# Patient Record
Sex: Female | Born: 1975 | Race: White | Hispanic: No | Smoking: Never smoker
Health system: Southern US, Community
[De-identification: ages and names within clinical notes are randomized; demographics above are authoritative.]

## PROBLEM LIST (undated history)

## (undated) ENCOUNTER — Inpatient Hospital Stay: Payer: Self-pay

## (undated) ENCOUNTER — Inpatient Hospital Stay: Admission: EM | Payer: Self-pay

## (undated) DIAGNOSIS — D649 Anemia, unspecified: Secondary | ICD-10-CM

## (undated) DIAGNOSIS — R011 Cardiac murmur, unspecified: Secondary | ICD-10-CM

## (undated) HISTORY — PX: WISDOM TOOTH EXTRACTION: SHX21

---

## 2002-02-20 ENCOUNTER — Other Ambulatory Visit: Admission: RE | Admit: 2002-02-20 | Discharge: 2002-02-20 | Payer: Self-pay | Admitting: Obstetrics and Gynecology

## 2002-07-17 ENCOUNTER — Inpatient Hospital Stay (HOSPITAL_COMMUNITY): Admission: AD | Admit: 2002-07-17 | Discharge: 2002-07-17 | Payer: Self-pay | Admitting: Obstetrics and Gynecology

## 2002-08-27 ENCOUNTER — Inpatient Hospital Stay (HOSPITAL_COMMUNITY): Admission: AD | Admit: 2002-08-27 | Discharge: 2002-08-30 | Payer: Self-pay | Admitting: Obstetrics and Gynecology

## 2002-08-31 ENCOUNTER — Encounter: Admission: RE | Admit: 2002-08-31 | Discharge: 2002-09-30 | Payer: Self-pay | Admitting: Obstetrics and Gynecology

## 2002-10-27 ENCOUNTER — Other Ambulatory Visit: Admission: RE | Admit: 2002-10-27 | Discharge: 2002-10-27 | Payer: Self-pay | Admitting: Obstetrics and Gynecology

## 2003-10-18 ENCOUNTER — Inpatient Hospital Stay (HOSPITAL_COMMUNITY): Admission: RE | Admit: 2003-10-18 | Discharge: 2003-10-18 | Payer: Self-pay | Admitting: Obstetrics and Gynecology

## 2003-10-22 ENCOUNTER — Inpatient Hospital Stay (HOSPITAL_COMMUNITY): Admission: AD | Admit: 2003-10-22 | Discharge: 2003-10-24 | Payer: Self-pay | Admitting: Obstetrics and Gynecology

## 2003-11-07 ENCOUNTER — Inpatient Hospital Stay (HOSPITAL_COMMUNITY): Admission: AD | Admit: 2003-11-07 | Discharge: 2003-11-07 | Payer: Self-pay | Admitting: Obstetrics & Gynecology

## 2003-11-20 ENCOUNTER — Ambulatory Visit: Payer: Self-pay | Admitting: Obstetrics and Gynecology

## 2003-11-20 ENCOUNTER — Ambulatory Visit (HOSPITAL_COMMUNITY): Admission: RE | Admit: 2003-11-20 | Discharge: 2003-11-20 | Payer: Self-pay | Admitting: Obstetrics and Gynecology

## 2003-11-21 ENCOUNTER — Inpatient Hospital Stay (HOSPITAL_COMMUNITY): Admission: AD | Admit: 2003-11-21 | Discharge: 2003-11-23 | Payer: Self-pay | Admitting: Obstetrics and Gynecology

## 2004-01-04 ENCOUNTER — Other Ambulatory Visit: Admission: RE | Admit: 2004-01-04 | Discharge: 2004-01-04 | Payer: Self-pay | Admitting: Obstetrics and Gynecology

## 2010-04-06 ENCOUNTER — Encounter: Payer: Self-pay | Admitting: Obstetrics and Gynecology

## 2014-11-02 LAB — OB RESULTS CONSOLE HIV ANTIBODY (ROUTINE TESTING): HIV: NONREACTIVE

## 2014-11-02 LAB — OB RESULTS CONSOLE RPR: RPR: NONREACTIVE

## 2014-11-02 LAB — OB RESULTS CONSOLE ANTIBODY SCREEN: Antibody Screen: NEGATIVE

## 2014-11-02 LAB — OB RESULTS CONSOLE HEPATITIS B SURFACE ANTIGEN: HEP B S AG: NEGATIVE

## 2015-02-28 ENCOUNTER — Encounter: Payer: Self-pay | Admitting: *Deleted

## 2015-02-28 ENCOUNTER — Observation Stay
Admission: EM | Admit: 2015-02-28 | Discharge: 2015-02-28 | Disposition: A | Discharge status: Home / Self Care | Payer: Medicaid - Out of State | Attending: Obstetrics & Gynecology | Admitting: Obstetrics & Gynecology

## 2015-02-28 ENCOUNTER — Observation Stay
Admission: EM | Admit: 2015-02-28 | Discharge: 2015-02-28 | Disposition: A | Discharge status: Home / Self Care | Payer: Medicaid - Out of State | Attending: Obstetrics and Gynecology | Admitting: Obstetrics and Gynecology

## 2015-02-28 ENCOUNTER — Observation Stay: Payer: Medicaid - Out of State

## 2015-02-28 DIAGNOSIS — O26892 Other specified pregnancy related conditions, second trimester: Secondary | ICD-10-CM | POA: Diagnosis not present

## 2015-02-28 DIAGNOSIS — Z3A24 24 weeks gestation of pregnancy: Secondary | ICD-10-CM | POA: Insufficient documentation

## 2015-02-28 DIAGNOSIS — O479 False labor, unspecified: Secondary | ICD-10-CM | POA: Diagnosis present

## 2015-02-28 DIAGNOSIS — R1031 Right lower quadrant pain: Secondary | ICD-10-CM

## 2015-02-28 DIAGNOSIS — O26899 Other specified pregnancy related conditions, unspecified trimester: Secondary | ICD-10-CM

## 2015-02-28 DIAGNOSIS — R112 Nausea with vomiting, unspecified: Secondary | ICD-10-CM

## 2015-02-28 DIAGNOSIS — R109 Unspecified abdominal pain: Secondary | ICD-10-CM | POA: Diagnosis present

## 2015-02-28 DIAGNOSIS — Z3A25 25 weeks gestation of pregnancy: Secondary | ICD-10-CM | POA: Insufficient documentation

## 2015-02-28 LAB — COMPREHENSIVE METABOLIC PANEL
ALT: 12 U/L — ABNORMAL LOW (ref 14–54)
AST: 15 U/L (ref 15–41)
Albumin: 3.1 g/dL — ABNORMAL LOW (ref 3.5–5.0)
Alkaline Phosphatase: 72 U/L (ref 38–126)
Anion gap: 5 (ref 5–15)
BUN: 10 mg/dL (ref 6–20)
CHLORIDE: 106 mmol/L (ref 101–111)
CO2: 21 mmol/L — ABNORMAL LOW (ref 22–32)
Calcium: 8.3 mg/dL — ABNORMAL LOW (ref 8.9–10.3)
Creatinine, Ser: 0.51 mg/dL (ref 0.44–1.00)
Glucose, Bld: 108 mg/dL — ABNORMAL HIGH (ref 65–99)
POTASSIUM: 3.5 mmol/L (ref 3.5–5.1)
Sodium: 132 mmol/L — ABNORMAL LOW (ref 135–145)
Total Bilirubin: 0.2 mg/dL — ABNORMAL LOW (ref 0.3–1.2)
Total Protein: 6.9 g/dL (ref 6.5–8.1)

## 2015-02-28 LAB — URINE DRUG SCREEN, QUALITATIVE (ARMC ONLY)
AMPHETAMINES, UR SCREEN: NOT DETECTED
Barbiturates, Ur Screen: NOT DETECTED
Benzodiazepine, Ur Scrn: NOT DETECTED
CANNABINOID 50 NG, UR ~~LOC~~: NOT DETECTED
Cocaine Metabolite,Ur ~~LOC~~: NOT DETECTED
MDMA (ECSTASY) UR SCREEN: NOT DETECTED
METHADONE SCREEN, URINE: NOT DETECTED
Opiate, Ur Screen: NOT DETECTED
Phencyclidine (PCP) Ur S: NOT DETECTED
TRICYCLIC, UR SCREEN: NOT DETECTED

## 2015-02-28 LAB — CBC
HCT: 34.8 % — ABNORMAL LOW (ref 35.0–47.0)
Hemoglobin: 11.6 g/dL — ABNORMAL LOW (ref 12.0–16.0)
MCH: 29.6 pg (ref 26.0–34.0)
MCHC: 33.2 g/dL (ref 32.0–36.0)
MCV: 89 fL (ref 80.0–100.0)
PLATELETS: 238 10*3/uL (ref 150–440)
RBC: 3.91 MIL/uL (ref 3.80–5.20)
RDW: 13.1 % (ref 11.5–14.5)
WBC: 12.1 10*3/uL — AB (ref 3.6–11.0)

## 2015-02-28 LAB — URINALYSIS COMPLETE WITH MICROSCOPIC (ARMC ONLY)
BILIRUBIN URINE: NEGATIVE
Bacteria, UA: NONE SEEN
GLUCOSE, UA: NEGATIVE mg/dL
HGB URINE DIPSTICK: NEGATIVE
Ketones, ur: NEGATIVE mg/dL
LEUKOCYTES UA: NEGATIVE
NITRITE: NEGATIVE
PH: 7 (ref 5.0–8.0)
Protein, ur: NEGATIVE mg/dL
RBC / HPF: NONE SEEN RBC/hpf (ref 0–5)
SPECIFIC GRAVITY, URINE: 1.004 — AB (ref 1.005–1.030)

## 2015-02-28 LAB — LIPASE, BLOOD: Lipase: 22 U/L (ref 11–51)

## 2015-02-28 MED ORDER — MORPHINE SULFATE (PF) 4 MG/ML IV SOLN
4.0000 mg | INTRAVENOUS | Status: AC
  Administered 2015-02-28: 4 mg via INTRAMUSCULAR

## 2015-02-28 MED ORDER — SODIUM CHLORIDE 0.9 % IV SOLN
8.0000 mg | INTRAVENOUS | Status: DC
  Filled 2015-02-28: qty 4

## 2015-02-28 MED ORDER — ACETAMINOPHEN 325 MG PO TABS: 650.0000 mg | ORAL_TABLET | ORAL | Status: DC | PRN

## 2015-02-28 MED ORDER — LACTATED RINGERS IV SOLN
INTRAVENOUS | Status: DC
  Administered 2015-02-28: 09:00:00 via INTRAVENOUS

## 2015-02-28 MED ORDER — LACTATED RINGERS IV BOLUS (SEPSIS): 500.0000 mL | Freq: Once | INTRAVENOUS | Status: DC

## 2015-02-28 MED ORDER — ONDANSETRON HCL 4 MG/2ML IJ SOLN: 4.0000 mg | Freq: Four times a day (QID) | INTRAMUSCULAR | Status: DC | PRN

## 2015-02-28 MED ORDER — PROMETHAZINE HCL 12.5 MG PO TABS: 25.0000 mg | ORAL_TABLET | Freq: Four times a day (QID) | ORAL | Status: DC | PRN

## 2015-02-28 MED ORDER — ONDANSETRON HCL 4 MG/2ML IJ SOLN
INTRAMUSCULAR | Status: AC
  Administered 2015-02-28: 4 mg
  Filled 2015-02-28: qty 4

## 2015-02-28 MED ORDER — FENTANYL CITRATE (PF) 100 MCG/2ML IJ SOLN
25.0000 ug | INTRAMUSCULAR | Status: DC
  Filled 2015-02-28: qty 2

## 2015-02-28 MED ORDER — MORPHINE SULFATE (PF) 4 MG/ML IV SOLN
INTRAVENOUS | Status: AC
  Administered 2015-02-28: 4 mg via INTRAMUSCULAR
  Filled 2015-02-28: qty 1

## 2015-02-28 NOTE — H&P (Signed)
OB History & Physical   History of Present Illness:  Chief Complaint: right side pain  HPI:  Shawna Sellers is a 39 y.o. [id number] female at [redacted]w[redacted]d dated by unknown criteria.  Her pregnancy has been without complication.    She denies contractions.   She denies leakage of fluid.   She denies vaginal bleeding.   She reports fetal movement.    She presents with acute-onset right sided pain that started at 4am this morning.  She rates the pain as 9/10.  It is located in her right flank, wrapping around from her right lower back to her right lower abdomen. The pain is vaguely described. It does not radiate.  Movement makes it worse. Sitting still makes it a little better.  She has had nausea and emesis associated with the pain. She denies fevers and chills.  Maternal Medical History:  History reviewed. No pertinent past medical history.  Past Surgical History  Procedure Laterality Date  . No past surgeries     Allergies: No Known Allergies  Prior to Admission medications   Medication Sig Start Date End Date Taking? Authorizing Provider  Prenatal Vit-Fe Fumarate-FA (MULTIVITAMIN-PRENATAL) 27-0.8 MG TABS tablet Take 1 tablet by mouth daily at 12 noon.   Yes Historical Provider, MD    OB History  Gravida Para Term Preterm AB SAB TAB Ectopic Multiple Living  # Outcome Date GA Lbr Len/2nd Weight Sex Delivery Anes PTL Lv  7 Current           6 Term 09/14/11     Vag-Spont   Y  5 Term 03/22/09     Vag-Spont   Y  4 Term 03/16/09     Vag-Spont   Y  3 Preterm 11/15/03 [redacted]w[redacted]d    Vag-Spont   Y  2 Term 08/28/02     Vag-Spont   Y  1 SAB               Prenatal care site: OBGYN in [place].   Social History: She  reports that she has never smoked. She has never used smokeless tobacco. She reports that she does not drink alcohol or use illicit drugs. Recently moved to the area from [place].  Family History: family history is not on file.   Review of Systems: Negative x 10  systems reviewed except as noted in the HPI.    Physical Exam:  Vital Signs: BP 126/73 mmHg  Pulse 70  Temp(Src) 97.6 F (36.4 C) (Oral)  Resp 16 General: moderate distress.  HEENT: normocephalic, atraumatic Heart: regular rate & rhythm.  No murmurs/rubs/gallops Lungs: clear to auscultation bilaterally Abdomen: soft, gravid, non-tender;  Right posterior flank along iliac crest tender to palpation extending anteriorly.  No rebound or guarding Extremities: non-tender, symmetric, no edema bilaterally.    Neurologic: Alert & oriented x 3.    Pertinent Results:  Prenatal Labs:  None known  Baseline FHR: normal range and characteristics given gestational age Contractions: absent  Overall assessment: reassuring given gestational age  Assessment:  Shawna Sellers is a 39 y.o. [phone] female at [redacted]w[redacted]d with right flank pain.  Differential includes; appendicitis, ureterolithiasis, MSK, placental abruption.  Plan:  1. Admit to observation  2. Will start IV and give IV hydration, along with pain medication and anti-emetic medication 3. Will obtain CBC, CMP, lipase, send UA, UDS. 4. GBS unknown.   5. Fetwal well-being: reassuring overall  Thomasene Mohair  D, MD 02/28/2015 7:02 AM

## 2015-02-28 NOTE — Final Progress Note (Signed)
Physician Final Progress Note  Patient ID: Shawna Sellers MRN: [id number] DOB/AGE: December 05, 1975 39 y.o.  Admit date: 02/28/2015 Admitting provider: Nadara Mustardobert P Kahlie Deutscher, MD Discharge date: 02/28/2015   Admission Diagnoses: Abdominal Pain  Discharge Diagnoses:  Active Problems:   * No active hospital problems. *   Abdominal Pain  Consults: None  Significant Findings/ Diagnostic Studies: FHTs normal, no contractions   Exam- no cervical dilation       Abd- NT, ND, gravid]       Back- no CVAT, no flank T  Discharge Condition: good  Disposition: 01-Home or Self Care  Diet: Regular diet  Discharge Activity: Activity as tolerated     Medication List    TAKE these medications        multivitamin-prenatal 27-0.8 MG Tabs tablet  Take 1 tablet by mouth daily at 12 noon.     promethazine 12.5 MG tablet  Commonly known as:  PHENERGAN  Take 2 tablets (25 mg total) by mouth every 6 (six) hours as needed for nausea or vomiting.         Total time spent taking care of this patient: 15 minutes  Signed: Letitia LibraHARRIS,Daxon Kyne PAUL 02/28/2015, 5:52 PM

## 2015-02-28 NOTE — Plan of Care (Signed)
Discharge instructions given and explained.  Verbalized understanding.  Signed copy on chart and one in hand.  

## 2015-02-28 NOTE — OB Triage Note (Signed)
Recvd from ED per wheelchair.  C/O flank pain, nausea and vomiting worsening since d/c from outpatient status today.  Changed to gown and to bed.  EFM applied.  Plan of care discussed.

## 2015-02-28 NOTE — Final Progress Note (Signed)
Physician Final Progress Note  Patient ID: Shawna MatinBetsy D Sellers MRN: [id number] DOB/AGE: 1975/07/15 39 y.o.  Admit date: 02/28/2015 Admitting provider: Conard NovakStephen D Jackson, MD Discharge date: 02/28/2015   Admission Diagnoses: Right Flank Pain  Discharge Diagnoses:  Active Problems:   Labor and delivery, indication for care   Right flank Pain   Abdominal Pain  Consults: None  Significant Findings/ Diagnostic Studies: labs: see results, no s/sx infection; and radiology: MRI: no evidence for appendicitis or other acute pathology and Ultrasound: normal other than mild hydeonephritis both kidneys  Procedures: NST normal FHTs and no ctx pattern c/w PTL  Discharge Condition: good  Disposition:  Home, [facility] ([place])  Diet: Regular diet  Discharge Activity: Activity as tolerated     Medication List    ASK your doctor about these medications        multivitamin-prenatal 27-0.8 MG Tabs tablet  Take 1 tablet by mouth daily at 12 noon.           Follow-up Information    Follow up with Conard NovakJackson, Stephen D, MD In 1 week.   Specialty:  Obstetrics and Gynecology   Why:  follow up OB care, first available   Contact information:   [address] [place] [address] [phone]       Total time spent taking care of this patient: multiple encounters  Signed: Letitia LibraHARRIS,Mance Vallejo PAUL 02/28/2015, 1:29 PM

## 2015-02-28 NOTE — Plan of Care (Signed)
Discharge teaching done and instructions reviewed.  Prescription for phenergan given and explained.  Verbalized understanding.  Signed copy of DC instructions on chart.

## 2015-02-28 NOTE — Progress Notes (Signed)
S: Morphine has helped a little. Pain has now moved to the RLQ. Leaked a little fluid that smelled like urine when she vomited. Has vomited about 5 times this AM. No diarrhea. Last BM yesterday was WNL. Denies fever/chills  O: General: lying quietly, but alert, and answering questions appropriately BP 126/73 mmHg  Pulse 70  Temp(Src) 97.6 F (36.4 C) (Oral)  Resp 16   Abdomen: palpated mild contraction, but no ctxs picking up with toco Reports tenderness at right border of uterus radiating to midline, no guarding.  FHR 150s with accels to 160s, mod variability Results for orders placed or performed during the hospital encounter of 02/28/15 (from the past 24 hour(s))  Urinalysis complete, with microscopic ([facility] only)     Status: Abnormal   Collection Time: 02/28/15  6:09 AM  Result Value Ref Range   Color, Urine STRAW (A) YELLOW   APPearance CLEAR (A) CLEAR   Glucose, UA NEGATIVE NEGATIVE mg/dL   Bilirubin Urine NEGATIVE NEGATIVE   Ketones, ur NEGATIVE NEGATIVE mg/dL   Specific Gravity, Urine 1.004 (L) 1.005 - 1.030   Hgb urine dipstick NEGATIVE NEGATIVE   pH 7.0 5.0 - 8.0   Protein, ur NEGATIVE NEGATIVE mg/dL   Nitrite NEGATIVE NEGATIVE   Leukocytes, UA NEGATIVE NEGATIVE   RBC / HPF NONE SEEN 0 - 5 RBC/hpf   WBC, UA 0-5 0 - 5 WBC/hpf   Bacteria, UA NONE SEEN NONE SEEN   Squamous Epithelial / LPF 0-5 (A) NONE SEEN  Urine Drug Screen, Qualitative ([facility] only)     Status: None   Collection Time: 02/28/15  6:09 AM  Result Value Ref Range   Tricyclic, Ur Screen NONE DETECTED NONE DETECTED   Amphetamines, Ur Screen NONE DETECTED NONE DETECTED   MDMA (Ecstasy)Ur Screen NONE DETECTED NONE DETECTED   Cocaine Metabolite,Ur [place] NONE DETECTED NONE DETECTED   Opiate, Ur Screen NONE DETECTED NONE DETECTED   Phencyclidine (PCP) Ur S NONE DETECTED NONE DETECTED   Cannabinoid 50 Ng, Ur [place] NONE DETECTED NONE DETECTED   Barbiturates, Ur Screen NONE DETECTED NONE DETECTED   Benzodiazepine, Ur  Scrn NONE DETECTED NONE DETECTED   Methadone Scn, Ur NONE DETECTED NONE DETECTED  CBC     Status: Abnormal   Collection Time: 02/28/15  6:43 AM  Result Value Ref Range   WBC 12.1 (H) 3.6 - 11.0 K/uL   RBC 3.91 3.80 - 5.20 MIL/uL   Hemoglobin 11.6 (L) 12.0 - 16.0 g/dL   HCT 16.1 (L) 09.6 - 04.5 %   MCV 89.0 80.0 - 100.0 fL   MCH 29.6 26.0 - 34.0 pg   MCHC 33.2 32.0 - 36.0 g/dL   RDW 40.9 81.1 - 91.4 %   Platelets 238 150 - 440 K/uL  Comprehensive metabolic panel     Status: Abnormal   Collection Time: 02/28/15  6:43 AM  Result Value Ref Range   Sodium 132 (L) 135 - 145 mmol/L   Potassium 3.5 3.5 - 5.1 mmol/L   Chloride 106 101 - 111 mmol/L   CO2 21 (L) 22 - 32 mmol/L   Glucose, Bld 108 (H) 65 - 99 mg/dL   BUN 10 6 - 20 mg/dL   Creatinine, Ser 7.82 0.44 - 1.00 mg/dL   Calcium 8.3 (L) 8.9 - 10.3 mg/dL   Total Protein 6.9 6.5 - 8.1 g/dL   Albumin 3.1 (L) 3.5 - 5.0 g/dL   AST 15 15 - 41 U/L   ALT 12 (L) 14 - 54  U/L   Alkaline Phosphatase 72 38 - 126 U/L   Total Bilirubin 0.2 (L) 0.3 - 1.2 mg/dL   GFR calc non Af Amer >60 >60 mL/min   GFR calc Af Amer >60 >60 mL/min   Anion gap 5 5 - 15  Lipase, blood     Status: None   Collection Time: 02/28/15  6:43 AM  Result Value Ref Range   Lipase 22 11 - 51 U/L  Abdominal ultrasound: bilateral hydronephrosis lt>rt, with mild proximal ureterectasis, no obstructing foci.  A: RLQ pain at [redacted] weeks gestation, can't rule out stone entirely, but need to rule out appendicitis.  P: MRI of pelvis and abdomen IV bolus Zofran for nausea/vomiting  Ailen Strauch, CNM

## 2015-02-28 NOTE — Discharge Instructions (Signed)

## 2015-03-17 NOTE — L&D Delivery Note (Signed)
VAGINAL DELIVERY NOTE:  Date of Delivery: 06/02/2015 Primary OB: [facility]  Gestational Age/EDD: 3730w6d 06/17/2015, by Patient Reported Antepartum complications: none Attending Physician: Annamarie MajorPaul Harris, MD, FACOG Delivery Type: spontaneous vaginal delivery  Anesthesia: none Laceration: none Episiotomy: none Placenta: spontaneous Intrapartum complications: precipitous delivery Estimated Blood Loss: <100 mL GBS: Neg Procedure Details: Rapid dilation from 4-10 with immediate delivery with nurse.  No neonatal or maternal complications.  Placenta spon delivery.  No lacerations.  Min bleeding. IM Pitocin given.  Baby: Liveborn female, Apgars 9/9, weight 2690g

## 2015-03-30 LAB — OB RESULTS CONSOLE RPR: RPR: NONREACTIVE

## 2015-04-23 LAB — OB RESULTS CONSOLE ABO/RH: RH Type: POSITIVE

## 2015-04-23 LAB — OB RESULTS CONSOLE RUBELLA ANTIBODY, IGM: Rubella: IMMUNE

## 2015-04-23 LAB — OB RESULTS CONSOLE VARICELLA ZOSTER ANTIBODY, IGG: VARICELLA IGG: NON-IMMUNE/NOT IMMUNE

## 2015-04-30 LAB — OB RESULTS CONSOLE HGB/HCT, BLOOD
HEMATOCRIT: 31 %
Hemoglobin: 10.3 g/dL

## 2015-04-30 LAB — OB RESULTS CONSOLE HIV ANTIBODY (ROUTINE TESTING): HIV: NONREACTIVE

## 2015-04-30 LAB — OB RESULTS CONSOLE RPR: RPR: NONREACTIVE

## 2015-05-01 LAB — OB RESULTS CONSOLE PLATELET COUNT: PLATELETS: 221 10*3/uL

## 2015-05-22 LAB — OB RESULTS CONSOLE GBS: GBS: NEGATIVE

## 2015-05-22 LAB — OB RESULTS CONSOLE GC/CHLAMYDIA
Chlamydia: NEGATIVE
Gonorrhea: NEGATIVE

## 2015-06-01 ENCOUNTER — Observation Stay
Admission: EM | Admit: 2015-06-01 | Discharge: 2015-06-01 | Disposition: A | Discharge status: Home / Self Care | Payer: Medicaid Other | Source: Home / Self Care | Admitting: Obstetrics and Gynecology

## 2015-06-01 ENCOUNTER — Inpatient Hospital Stay
Admission: EM | Admit: 2015-06-01 | Discharge: 2015-06-03 | DRG: 775 | Disposition: A | Discharge status: Home / Self Care | Payer: Medicaid Other | Attending: Obstetrics & Gynecology | Admitting: Obstetrics & Gynecology

## 2015-06-01 ENCOUNTER — Encounter: Payer: Self-pay | Admitting: *Deleted

## 2015-06-01 DIAGNOSIS — O479 False labor, unspecified: Secondary | ICD-10-CM | POA: Diagnosis present

## 2015-06-01 DIAGNOSIS — Z3A37 37 weeks gestation of pregnancy: Secondary | ICD-10-CM

## 2015-06-01 DIAGNOSIS — R109 Unspecified abdominal pain: Secondary | ICD-10-CM | POA: Diagnosis present

## 2015-06-01 HISTORY — DX: Cardiac murmur, unspecified: R01.1

## 2015-06-01 HISTORY — DX: Anemia, unspecified: D64.9

## 2015-06-01 MED ORDER — ACETAMINOPHEN 325 MG PO TABS: 650.0000 mg | ORAL_TABLET | ORAL | Status: DC | PRN

## 2015-06-01 MED ORDER — ONDANSETRON HCL 4 MG/2ML IJ SOLN: 4.0000 mg | Freq: Four times a day (QID) | INTRAMUSCULAR | Status: DC | PRN

## 2015-06-01 NOTE — OB Triage Note (Signed)
Reports u/c's at 0300 this am, continued intermittently with bloody show. Came in at this am after 7a, released at approx 1600. States she now thinks she may have had srom, and arriving here now- noticed watery d/c coming down thigh. Reports u/c's became more regular at 1800 with walking at store

## 2015-06-01 NOTE — Progress Notes (Signed)
Dr Tiburcio PeaHarris given report, requests to observe pt for another hour, and recheck cervix. Aware pt concerned that she would like an epidural, as for the last 3 deliveries she went too quickly to receive one. Dr Tiburcio PeaHarris stated if she changed to 5 cm she may be admitted and can then have her epidural

## 2015-06-01 NOTE — Final Progress Note (Signed)
Physician Final Progress Note  Patient ID: Shawna MatinBetsy D Winfree MRN: [id number] DOB/AGE: 1975/05/13 39 y.o.  Admit date: 06/01/2015 Admitting provider: Annamarie MajorPaul Freedom Lopezperez, MD Discharge date: 06/01/2015   Admission Diagnoses: Back pain, Contraction pain  Discharge Diagnoses:  Active Problems:   Irregular contractions   Back pain  Consults: None  Significant Findings/ Diagnostic Studies: Monitoring and assessment for pain, labor    Exam- NAD                Heart reg rate/ rhythm                Chest  CTA B                Extr no edema                SVE  3/50/-3  (3 exams over 4 hours, no change)                Toco irreg ctxs                 FHT 150s, see NST  Procedures: A NST procedure was performed with FHR monitoring and a normal baseline established, appropriate time of 20-40 minutes of evaluation, and accels >2 seen w 15x15 characteristics.  Results show a REACTIVE NST.   Discharge Condition: good  Disposition: 01-Home or Self Care  Diet: Regular diet  Discharge Activity: Activity as tolerated     Medication List    ASK your doctor about these medications        multivitamin-prenatal 27-0.8 MG Tabs tablet  Take 1 tablet by mouth daily at 12 noon.     promethazine 12.5 MG tablet  Commonly known as:  PHENERGAN  Take 2 tablets (25 mg total) by mouth every 6 (six) hours as needed for nausea or vomiting.           Follow-up Information    Follow up with Letitia Libraobert Paul Amond Speranza, MD In 5 days.   Specialty:  Obstetrics and Gynecology   Contact information:   [address] [place] [address] [phone]       Total time spent taking care of this patient: 15 minutes  Signed: Letitia Libraobert Paul Mira Balon 06/01/2015, 3:27 PM

## 2015-06-02 DIAGNOSIS — Z3A37 37 weeks gestation of pregnancy: Secondary | ICD-10-CM | POA: Diagnosis not present

## 2015-06-02 LAB — CBC
HEMATOCRIT: 28.4 % — AB (ref 35.0–47.0)
HEMOGLOBIN: 9.7 g/dL — AB (ref 12.0–16.0)
MCH: 29.2 pg (ref 26.0–34.0)
MCHC: 34.2 g/dL (ref 32.0–36.0)
MCV: 85.2 fL (ref 80.0–100.0)
Platelets: 215 10*3/uL (ref 150–440)
RBC: 3.34 MIL/uL — ABNORMAL LOW (ref 3.80–5.20)
RDW: 13.5 % (ref 11.5–14.5)
WBC: 11.6 10*3/uL — ABNORMAL HIGH (ref 3.6–11.0)

## 2015-06-02 MED ORDER — ZOLPIDEM TARTRATE 5 MG PO TABS: 5.0000 mg | ORAL_TABLET | Freq: Every evening | ORAL | Status: DC | PRN

## 2015-06-02 MED ORDER — ACETAMINOPHEN 325 MG PO TABS: 650.0000 mg | ORAL_TABLET | ORAL | Status: DC | PRN

## 2015-06-02 MED ORDER — LANOLIN HYDROUS EX OINT: TOPICAL_OINTMENT | CUTANEOUS | Status: DC | PRN

## 2015-06-02 MED ORDER — DIBUCAINE 1 % RE OINT: 1.0000 "application " | TOPICAL_OINTMENT | RECTAL | Status: DC | PRN

## 2015-06-02 MED ORDER — OXYTOCIN 10 UNIT/ML IJ SOLN
10.0000 [IU] | Freq: Once | INTRAMUSCULAR | Status: DC
  Filled 2015-06-02: qty 1

## 2015-06-02 MED ORDER — BENZOCAINE-MENTHOL 20-0.5 % EX AERO: 1.0000 "application " | INHALATION_SPRAY | CUTANEOUS | Status: DC | PRN

## 2015-06-02 MED ORDER — ONDANSETRON HCL 4 MG/2ML IJ SOLN: 4.0000 mg | Freq: Four times a day (QID) | INTRAMUSCULAR | Status: DC | PRN

## 2015-06-02 MED ORDER — IBUPROFEN 600 MG PO TABS
600.0000 mg | ORAL_TABLET | Freq: Four times a day (QID) | ORAL | Status: DC
  Administered 2015-06-02 – 2015-06-03 (×6): 600 mg via ORAL
  Filled 2015-06-02 (×6): qty 1

## 2015-06-02 MED ORDER — ONDANSETRON HCL 4 MG PO TABS: 4.0000 mg | ORAL_TABLET | ORAL | Status: DC | PRN

## 2015-06-02 MED ORDER — SIMETHICONE 80 MG PO CHEW: 80.0000 mg | CHEWABLE_TABLET | ORAL | Status: DC | PRN

## 2015-06-02 MED ORDER — SENNOSIDES-DOCUSATE SODIUM 8.6-50 MG PO TABS
2.0000 | ORAL_TABLET | ORAL | Status: DC
Start: 2015-06-03 — End: 2015-06-03
  Administered 2015-06-02: 2 via ORAL
  Filled 2015-06-02: qty 2

## 2015-06-02 MED ORDER — LIDOCAINE HCL (PF) 1 % IJ SOLN
INTRAMUSCULAR | Status: AC
  Filled 2015-06-02: qty 30

## 2015-06-02 MED ORDER — WITCH HAZEL-GLYCERIN EX PADS: 1.0000 "application " | MEDICATED_PAD | CUTANEOUS | Status: DC | PRN

## 2015-06-02 MED ORDER — AMMONIA AROMATIC IN INHA
RESPIRATORY_TRACT | Status: AC
  Filled 2015-06-02: qty 10

## 2015-06-02 MED ORDER — ACETAMINOPHEN 325 MG PO TABS
650.0000 mg | ORAL_TABLET | ORAL | Status: DC | PRN
  Administered 2015-06-02: 650 mg via ORAL
  Filled 2015-06-02: qty 2

## 2015-06-02 MED ORDER — DIPHENHYDRAMINE HCL 25 MG PO CAPS: 25.0000 mg | ORAL_CAPSULE | Freq: Four times a day (QID) | ORAL | Status: DC | PRN

## 2015-06-02 MED ORDER — VARICELLA VIRUS VACCINE LIVE 1350 PFU/0.5ML IJ SUSR
0.5000 mL | Freq: Once | INTRAMUSCULAR | Status: AC
  Administered 2015-06-03: 0.5 mL via SUBCUTANEOUS
  Filled 2015-06-02 (×2): qty 0.5

## 2015-06-02 MED ORDER — OXYCODONE-ACETAMINOPHEN 5-325 MG PO TABS: 1.0000 | ORAL_TABLET | ORAL | Status: DC | PRN

## 2015-06-02 MED ORDER — ONDANSETRON HCL 4 MG/2ML IJ SOLN: 4.0000 mg | INTRAMUSCULAR | Status: DC | PRN

## 2015-06-02 MED ORDER — MISOPROSTOL 200 MCG PO TABS
ORAL_TABLET | ORAL | Status: AC
  Filled 2015-06-02: qty 4

## 2015-06-02 MED ORDER — IBUPROFEN 600 MG PO TABS
ORAL_TABLET | ORAL | Status: AC
  Filled 2015-06-02: qty 1

## 2015-06-02 MED ORDER — OXYTOCIN 10 UNIT/ML IJ SOLN
INTRAMUSCULAR | Status: AC
Start: 2015-06-02 — End: 2015-06-02
  Filled 2015-06-02: qty 2

## 2015-06-02 MED ORDER — OXYCODONE-ACETAMINOPHEN 5-325 MG PO TABS: 2.0000 | ORAL_TABLET | ORAL | Status: DC | PRN

## 2015-06-02 MED ORDER — OXYTOCIN 10 UNIT/ML IJ SOLN
10.0000 [IU] | Freq: Once | INTRAMUSCULAR | Status: AC
  Administered 2015-06-02: 10 [IU] via INTRAMUSCULAR

## 2015-06-02 NOTE — H&P (Signed)
OB History & Physical   History of Present Illness:  Chief Complaint: LABOR  HPI:  Shawna Sellers is a [age] y.o. [id number] female at 37 weeks with worsening ctxs.  Here earlier topay and was 3 cm on three occasions over several hours.  Went home and now is back w more labor sx's. Her pregnancy has been without complication.   She denies leakage of fluid.   She denies vaginal bleeding.   She reports fetal movement.    Maternal Medical History:   Past Medical History  Diagnosis Date  . Anemia     pt to treat with diet  . Heart murmur     states years ago she had a slight heart murmer, no tx  . Preterm labor     36 week deliveryx1    Past Surgical History  Procedure Laterality Date  . Wisdom tooth extraction     Allergies: No Known Allergies  Prior to Admission medications   Medication Sig Start Date End Date Taking? Authorizing Provider  Prenatal Vit-Fe Fumarate-FA (MULTIVITAMIN-PRENATAL) 27-0.8 MG TABS tablet Take 1 tablet by mouth daily at 12 noon.   Yes Historical Provider, MD    OB History  Gravida Para Term Preterm AB SAB TAB Ectopic Multiple Living  6 4 3 1 1 1  0 0 0 4    # Outcome Date GA Lbr Len/2nd Weight Sex Delivery Anes PTL Lv  6 Current           5 Term 09/14/11     Vag-Spont   Y  4 Term 03/22/09     Vag-Spont   Y  3 Preterm 11/15/03 2313w0d    Vag-Spont   Y  2 Term 08/28/02     Vag-Spont   Y  1 SAB               [facility]   Social History: She  reports that she has never smoked. She has never used smokeless tobacco. She reports that she does not drink alcohol or use illicit drugs. [place]  Family History: family history includes Cancer in her paternal grandfather and paternal grandmother; Diabetes in her maternal grandfather; Hypertension in her maternal grandfather and maternal grandmother.   Review of Systems: Negative x 10 systems reviewed except as noted in the HPI.    Physical Exam:  Vital  Signs: BP 113/65 mmHg  Pulse 80  Temp(Src) 98.7 F (37.1 C) (Oral)  Resp 22 General: moderate distress.  Abdomen:  gravid, tender w ctxs Extremities: non-tender, symmetric, no edema bilaterally.    Neurologic: Alert & oriented x 3. SVE: 4/60/-2    Pertinent Results:  Baseline FHR: 140s, reactive NST Contractions: every 5 min  Assessment:  Shawna Sellers is a 40 y.o. [id number] female at 5737+ with labor sx's.  Plan:  1. Fetal well-being: reassuring overall 2. Labor mgt 3. Pt delivered in precipitous fashion, see note.  (going from 4 cm to 10 without much warning). 4. No pp BC desired 5. Breast feeding 6. Varivax needed pp  Letitia Libraobert Paul Aryaman Haliburton, MD 06/02/2015 12:22 AM

## 2015-06-02 NOTE — Lactation Note (Signed)
This note was copied from a baby's chart. Lactation Consultation Note  Patient Name: Shawna Sellers [id number]'s Date: 06/02/2015 Reason for consult: Initial assessment Mom experienced breast feeder.  Shawna Sellers was sleepy for this breast feed.  Demonstrated waking techniques.  Stripped down to just pamper and placed skin to skin in cross cradle hold with pillow support.  Can easily hand express copious amounts of colostrum.  Shawna Sellers latched immediately after waking and continued with good rhythmic sucking and swallowing for over 10 minute interval.  Mom latched her independently to second breast Maternal Data Formula Feeding for Exclusion: No Has patient been taught Hand Expression?: Yes Does the patient have breastfeeding experience prior to this delivery?: Yes  Feeding Feeding Type: Breast Fed Length of feed: 10 min (one side)  LATCH Score/Interventions Latch: Grasps breast easily, tongue down, lips flanged, rhythmical sucking. (After got awake ) Intervention(s): Adjust position;Assist with latch;Breast massage;Breast compression  Audible Swallowing: A few with stimulation Intervention(s): Skin to skin;Hand expression Intervention(s): Skin to skin;Hand expression;Alternate breast massage  Type of Nipple: Everted at rest and after stimulation  Comfort (Breast/Nipple): Soft / non-tender     Hold (Positioning): Assistance needed to correctly position infant at breast and maintain latch. Intervention(s): Breastfeeding basics reviewed;Support Pillows;Position options;Skin to skin  LATCH Score: 8  Lactation Tools Discussed/Used WIC Program: Yes (Also has [employer])   Consult Status Consult Status: Follow-up Date: 06/02/15 Follow-up type: Call as needed    Louis MeckelWilliams, Raylon Lamson Kay 06/02/2015, 11:35 AM

## 2015-06-02 NOTE — Progress Notes (Signed)
Admit Date: 06/01/2015 Today's Date: 06/02/2015  Post Partum Day 1  Subjective:  no complaints  Objective: Temp:  [98.1 F (36.7 C)-98.7 F (37.1 C)] 98.2 F (36.8 C) (03/19 0811) Pulse Rate:  [65-99] 81 (03/19 0811) Resp:  [16-22] 18 (03/19 0811) BP: (83-127)/(60-79) 104/60 mmHg (03/19 0811) SpO2:  [98 %-99 %] 98 % (03/19 0811) Weight:  [74.844 kg (165 lb)] 74.844 kg (165 lb) (03/18 2230)  Physical Exam:  General: alert, cooperative and no distress Lochia: appropriate Uterine Fundus: firm Incision: none DVT Evaluation: No evidence of DVT seen on physical exam.   Recent Labs  06/02/15 0532  HGB 9.7*  HCT 28.4*    Assessment/Plan: Plan for discharge tomorrow, Breastfeeding and Infant doing well   LOS: 0 days   Shawna Sellers [facility] 06/02/2015, 9:37 AM

## 2015-06-02 NOTE — Progress Notes (Signed)
Pt transferred via w/c with sister and belongings accompanying to LDR 2. Pt recently becoming more uncomfortable with u/c's- requesting pain med or epidural. Will re assess cx and call for order.

## 2015-06-02 NOTE — Discharge Summary (Signed)
Obstetrical Discharge Summary  Date of Admission: 06/01/2015 Date of Discharge: 06/03/2015 Discharge Diagnosis: Term Pregnancy-delivered Primary OB:  [facility]   Gestational Age at Delivery: 7540w6d  Antepartum complications: none Date of Delivery: 06/02/15  Delivered By: Tiburcio PeaHarris Delivery Type: spontaneous vaginal delivery Intrapartum complications/course: Precipitous delivery Anesthesia: none Placenta: spontaneous Laceration: none Episiotomy: none Live born F/ [place] Birth Weight:  2690 g APGAR: 9, 9  Post partum course: Since the delivery, patient has tolerated  activity, diet, and daily functions without difficulty or complication.  Min lochia.  No breast concerns at this time.  No signs of depression currently.   Postpartum Exam:General appearance: alert, cooperative and no distress GI: Fundus firm Extremities: extremities normal, atraumatic, no cyanosis or edema and spider veins noted on left lateral thigh Lochia minimal  Disposition: home with infant Rh Immune globulin given: not applicable Rubella vaccine given: no Varicella vaccine given: yes Tdap vaccine given in AP or PP setting: given during prenatal care Flu vaccine given in AP or PP setting: given during prenatal care Contraception: to be determined at post partum visit Results for orders placed or performed during the hospital encounter of 06/01/15 (from the past 48 hour(s))  RPR     Status: None   Collection Time: 06/02/15  5:32 AM  Result Value Ref Range   RPR Ser Ql Non Reactive Non Reactive    Comment: (NOTE) Performed At: [facility] [address] [place] [id number] Mila HomerHancock William F MD KG:[phone]   CBC     Status: Abnormal   Collection Time: 06/02/15  5:32 AM  Result Value Ref Range   WBC 11.6 (H) 3.6 - 11.0 K/uL   RBC 3.34 (L) 3.80 - 5.20 MIL/uL   Hemoglobin 9.7 (L) 12.0 - 16.0 g/dL   HCT 64.428.4 (L) 03.435.0 - 74.247.0 %   MCV 85.2 80.0 - 100.0 fL   MCH 29.2 26.0 - 34.0 pg   MCHC 34.2  32.0 - 36.0 g/dL   RDW 59.513.5 63.811.5 - 75.614.5 %   Platelets 215 150 - 440 K/uL   Prenatal Labs: A POS/Rubella Immune//Varicella NI//RPR negative//HIV negative/HepB Surface Ag negative//plans to breastfeed  Plan:  Shawna LikensBetsy D Xxxbalian was discharged to home in good condition. [facility]  Discharge Medications:   Medication List    STOP taking these medications        promethazine 12.5 MG tablet  Commonly known as:  PHENERGAN      TAKE these medications        ibuprofen 600 MG tablet  Commonly known as:  ADVIL,MOTRIN  Take 1 tablet (600 mg total) by mouth every 6 (six) hours as needed for mild pain, moderate pain or cramping.     multivitamin-prenatal 27-0.8 MG Tabs tablet  Take 1 tablet by mouth daily at 12 noon.         Farrel ConnersGUTIERREZ, Sarrah Fiorenza, CNM

## 2015-06-02 NOTE — Progress Notes (Signed)
Patient's visitor came to desk saying patients water broke and that the baby was coming. RN arrived and baby was on perineum. MD notified. RN delivery at  2357 of viable female, spontaneous cry, baby placed on moms chest.

## 2015-06-03 LAB — RPR: RPR: NONREACTIVE

## 2015-06-03 MED ORDER — IBUPROFEN 600 MG PO TABS: 600.0000 mg | ORAL_TABLET | Freq: Four times a day (QID) | ORAL | Status: AC | PRN
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
## 2015-06-03 NOTE — Progress Notes (Signed)
Education provided on need for Influenza vaccine.  Pt states that she received TDaP vaccine on 07/04/14.  Pt declines TDaP and Influenza vaccine at this time. Reynold BowenSusan Paisley Judas Mohammad, RN 06/03/2015 12:31 PM

## 2015-06-03 NOTE — Discharge Instructions (Signed)
Vaginal Delivery, Care After °Refer to this sheet in the next few weeks. These discharge instructions provide you with information on caring for yourself after delivery. Your health care provider may also give you specific instructions. Your treatment has been planned according to the most current medical practices available, but problems sometimes occur. Call your health care provider if you have any problems or questions after you go home. °HOME CARE INSTRUCTIONS °· Take over-the-counter or prescription medicines only as directed by your health care provider or pharmacist. °· Do not drink alcohol, especially if you are breastfeeding or taking medicine to relieve pain. °· Do not chew or smoke tobacco. °· Do not use illegal drugs. °· Continue to use good perineal care. Good perineal care includes: °¨ Wiping your perineum from front to back. °¨ Keeping your perineum clean. °· Do not use tampons or douche until your health care provider says it is okay. °· Shower, wash your hair, and take tub baths as directed by your health care provider. °· Wear a well-fitting bra that provides breast support. °· Eat healthy foods. °· Drink enough fluids to keep your urine clear or pale yellow. °· Eat high-fiber foods such as whole grain cereals and breads, brown rice, beans, and fresh fruits and vegetables every day. These foods may help prevent or relieve constipation. °· Follow your health care provider's recommendations regarding resumption of activities such as climbing stairs, driving, lifting, exercising, or traveling. °· Talk to your health care provider about resuming sexual activities. Resumption of sexual activities is dependent upon your risk of infection, your rate of healing, and your comfort and desire to resume sexual activity. °· Try to have someone help you with your household activities and your newborn for at least a few days after you leave the hospital. °· Rest as much as possible. Try to rest or take a nap  when your newborn is sleeping. °· Increase your activities gradually. °· Keep all of your scheduled postpartum appointments. It is very important to keep your scheduled follow-up appointments. At these appointments, your health care provider will be checking to make sure that you are healing physically and emotionally. °SEEK MEDICAL CARE IF:  °· You are passing large clots from your vagina. Save any clots to show your health care provider. °· You have a foul smelling discharge from your vagina. °· You have trouble urinating. °· You are urinating frequently. °· You have pain when you urinate. °· You have a change in your bowel movements. °· You have increasing redness, pain, or swelling near your vaginal incision (episiotomy) or vaginal tear. °· You have pus draining from your episiotomy or vaginal tear. °· Your episiotomy or vaginal tear is separating. °· You have painful, hard, or reddened breasts. °· You have a severe headache. °· You have blurred vision or see spots. °· You feel sad or depressed. °· You have thoughts of hurting yourself or your newborn. °· You have questions about your care, the care of your newborn, or medicines. °· You are dizzy or light-headed. °· You have a rash. °· You have nausea or vomiting. °· You were breastfeeding and have not had a menstrual period within 12 weeks after you stopped breastfeeding. °· You are not breastfeeding and have not had a menstrual period by the 12th week after delivery. °· You have a fever. °SEEK IMMEDIATE MEDICAL CARE IF:  °· You have persistent pain. °· You have chest pain. °· You have shortness of breath. °· You faint. °· You   have leg pain. °· You have stomach pain. °· Your vaginal bleeding saturates two or more sanitary pads in 1 hour. °  °This information is not intended to replace advice given to you by your health care provider. Make sure you discuss any questions you have with your health care provider. °  °Document Released: 02/28/2000 Document Revised:  11/21/2014 Document Reviewed: 10/28/2011 °Elsevier Interactive Patient Education ©2016 [employer] ° °Call your doctor for increased pain or vaginal bleeding, temperature above 100.4, depression, or concerns.  No strenuous activity or heavy lifting for 6 weeks.  No intercourse, tampons, douching, or enemas for 6 weeks.  No tub baths-showers only.  No driving for 2 weeks or while taking pain medications.  Continue prenatal vitamin and iron.  Increase calories and fluids while breastfeeding. ° °

## 2015-06-03 NOTE — Progress Notes (Signed)
Discharge instructions provided.  Pt verbalizes understanding of all instructions and follow-up care.  Pt discharged to home with infant at 1525 on 06/03/15 via wheelchair by volunteer. Reynold BowenSusan Paisley Marckus Hanover, RN 06/03/2015 3:37 PM

## 2016-11-02 IMAGING — MR MR PELVIS WO/W CM
4 of 7 series · 22 of 48 positions shown · non-contrast
Comparison: Ultrasound exam of 02/28/2015

CLINICAL DATA: Antepartum right lower quadrant abdominal pain, 24
weeks pregnant. Nausea and vomiting. Assessment for appendicitis.

EXAM:
MRI ABDOMEN AND PELVIS WITHOUT CONTRAST
TECHNIQUE: Multiplanar multisequence MR imaging of the abdomen and pelvis was
performed. No intravenous contrast was administered.

[Series 3: T2 · axial · 5.0mm · 0.74mm/px · z∈[-167,+187]mm · 7 of 60 slices shown (1 of 2)]
[im 1/60]
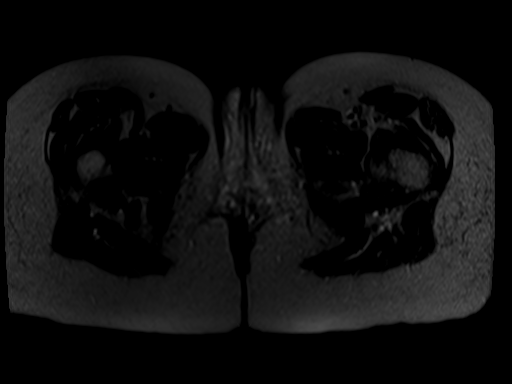
[im 10/60]
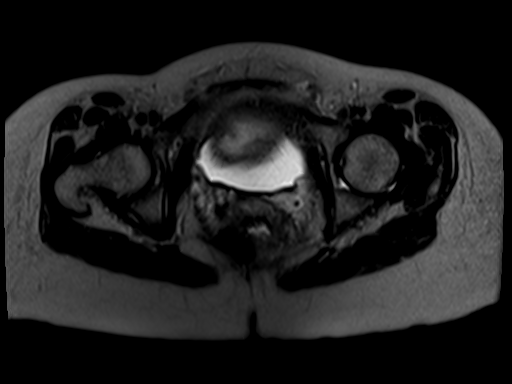
[im 20/60]
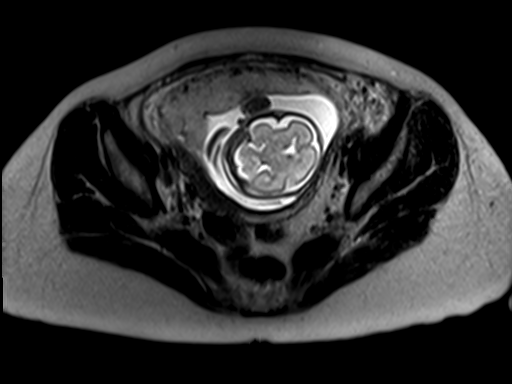
[im 30/60]
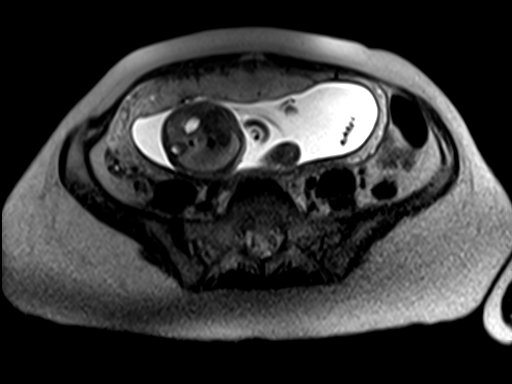
[im 40/60]
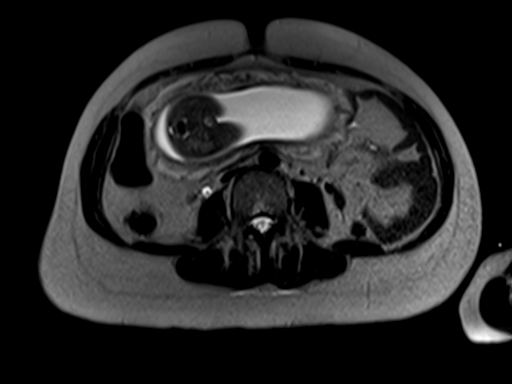
[im 50/60]
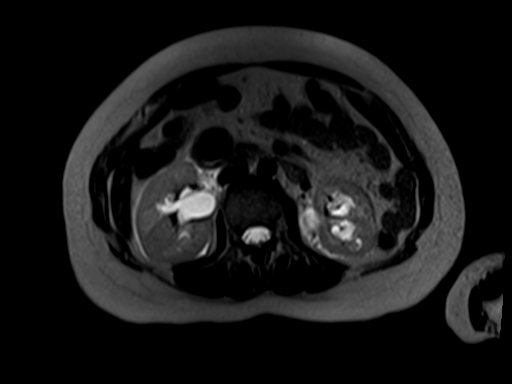
[im 60/60]
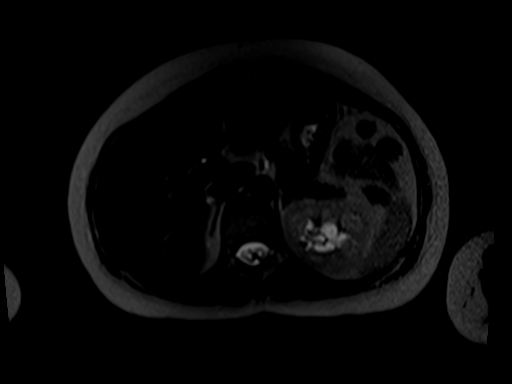

[Series 5: T2 fat-sat · axial · 5.0mm · 0.74mm/px · z∈[-167,+187]mm · 7 of 60 slices shown (1 of 2)]
[im 1/60]
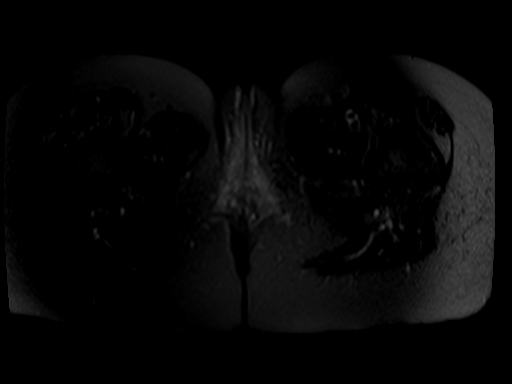
[im 10/60]
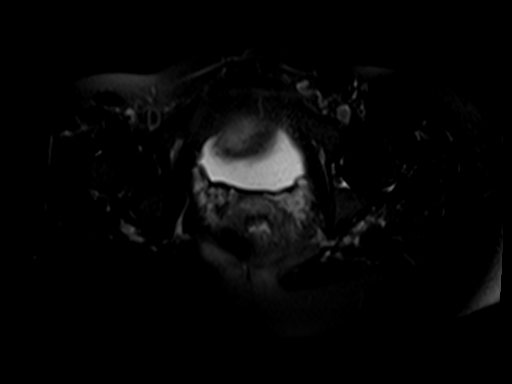
[im 20/60]
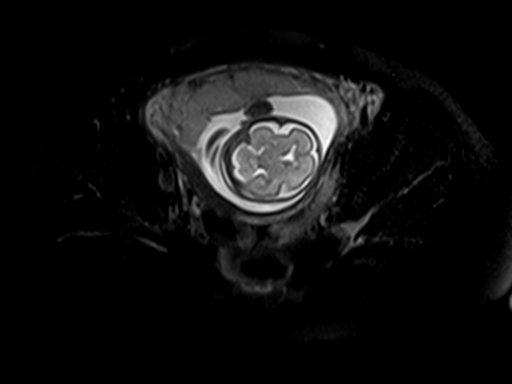
[im 30/60]
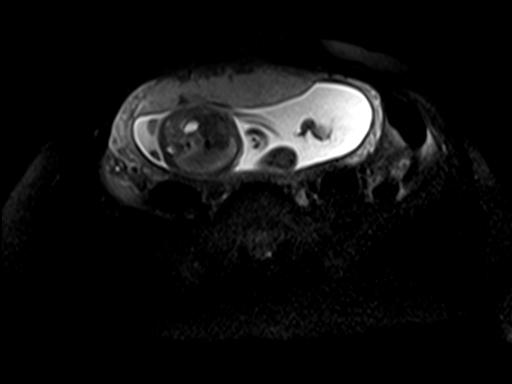
[im 40/60]
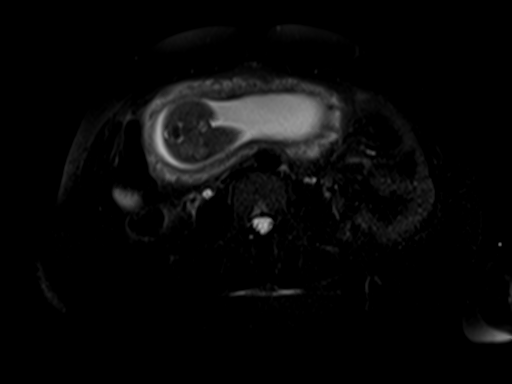
[im 50/60]
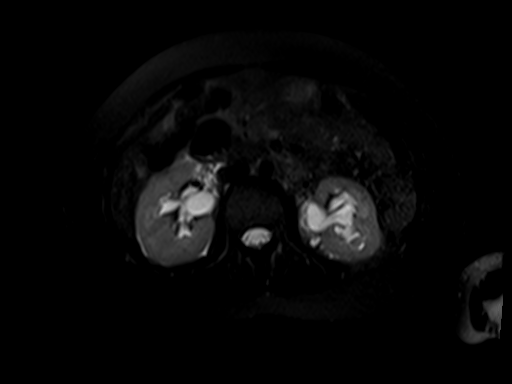
[im 60/60]
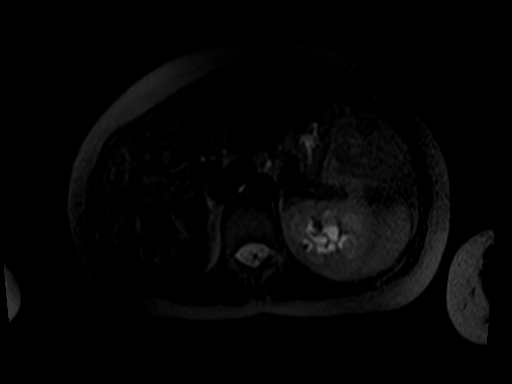

[Series 7: T2 · coronal · 6.0mm · 0.82mm/px · 4 of 30 slices shown (2 of 2)]
[im 1/30]
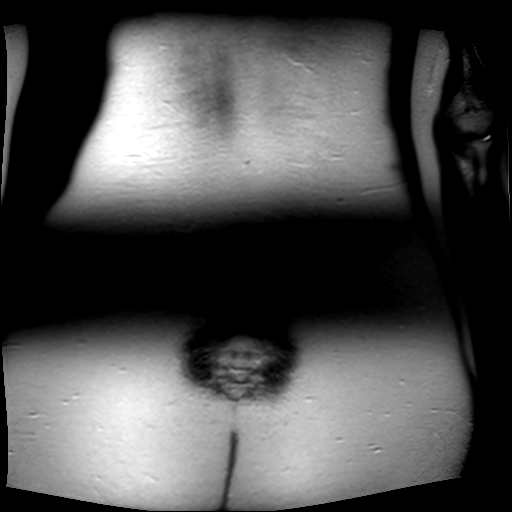
[im 10/30]
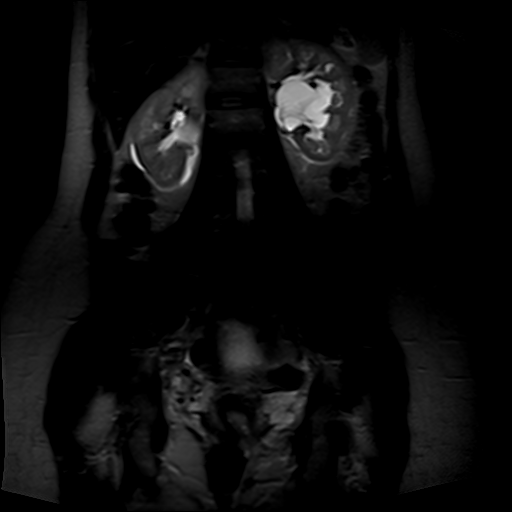
[im 20/30]
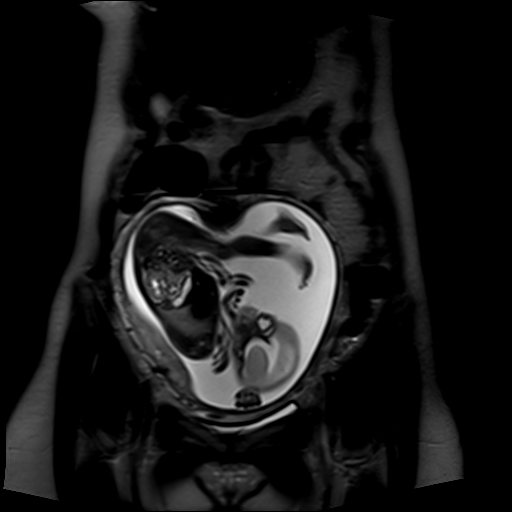
[im 30/30]
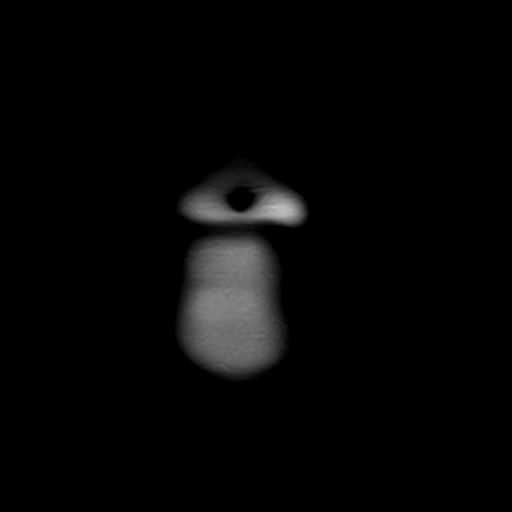

[Series 8: T2 fat-sat · coronal · 6.0mm · 0.82mm/px · 4 of 30 slices shown (2 of 2)]
[im 1/30]
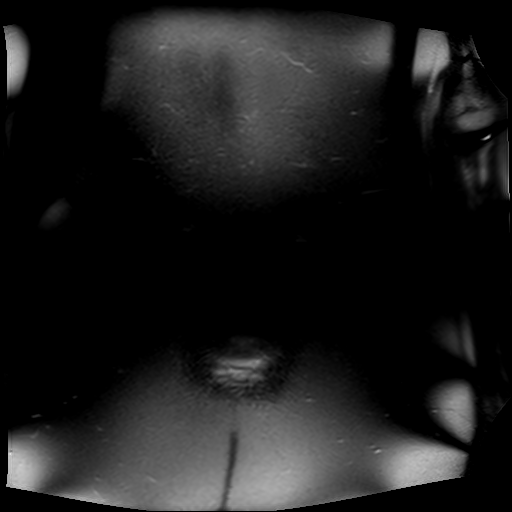
[im 10/30]
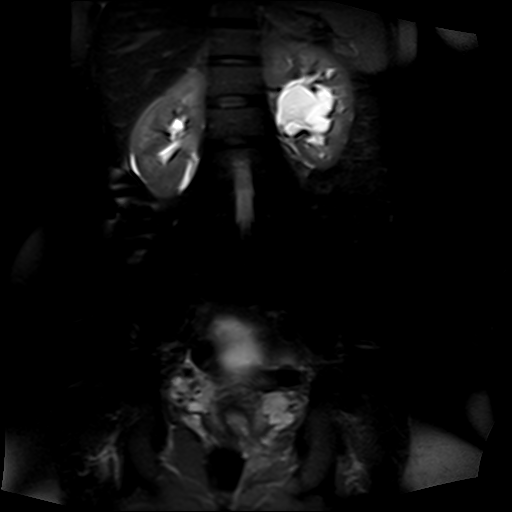
[im 20/30]
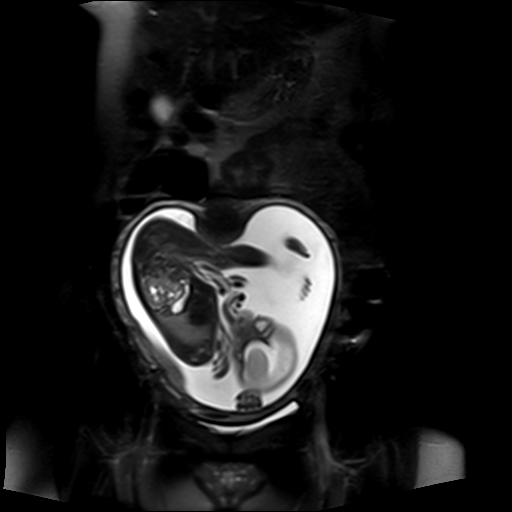
[im 30/30]
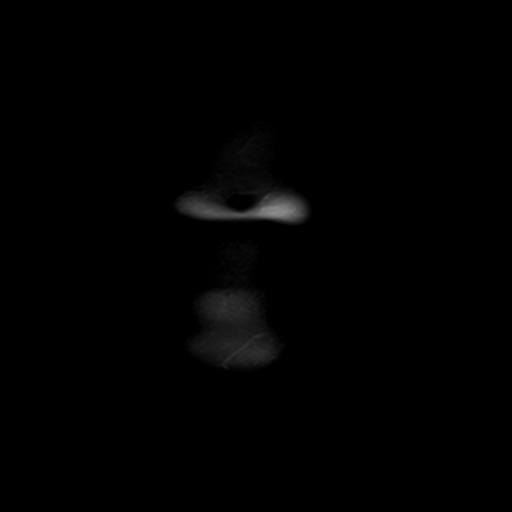

[22 of 48 positions shown; findings below may reference images not displayed]

FINDINGS: MRI ABDOMEN FINDINGS

Visualized portions of the liver, spleen, pancreas, and adrenal
glands unremarkable. Gallbladder normal; no biliary dilatation
observed.

There is abnormal right perirenal stranding along with right
hydronephrosis and borderline right hydroureter extending towards
the iliac vessel cross over.

There is also mild left hydronephrosis company by probable left
perirenal cysts. No left hydroureter.

No significant upper abdominal bowel abnormality.

MRI PELVIS FINDINGS

The cecum the street double over the fundus of the uterus on the
right. A small caliber tubular structure extending from the cecum on
images 17 through 21 of series 9 is believed to represent part of
the appendix and is normal in caliber.

Anterior placenta. Singleton pregnancy. Nuchal cord appears to
extend around 360 degrees.

Urinary bladder unremarkable. Distal right ureter caliber normal. No
abnormal osseous edema
IMPRESSION: 1. Maternal right perirenal stranding and mild right hydronephrosis
with borderline right hydroureter extending down to the level where
the ureter is slightly compressed between the psoas muscle in the
uterus. Periureteral stranding may be due to obstruction or
forniceal rupture.
2. There is mild left maternal hydronephrosis along with left
peripelvic cysts.
3. Cecum draped over the right uterine fundus, with a candidate
structure for the appendix appearing normal (no findings of
appendicitis).
4. Suspected fetal nuchal cord, extending around 360 degrees. This
likely warrants sonographic monitoring to assess for resolution.
